# Patient Record
Sex: Male | Born: 2000 | Race: Black or African American | Hispanic: No | Marital: Single | Smoking: Never smoker
Health system: Southern US, Community
[De-identification: ages and names within clinical notes are randomized; demographics above are authoritative.]

## PROBLEM LIST (undated history)

## (undated) DIAGNOSIS — J45909 Unspecified asthma, uncomplicated: Secondary | ICD-10-CM

## (undated) DIAGNOSIS — J302 Other seasonal allergic rhinitis: Secondary | ICD-10-CM

---
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
## 2019-09-10 ENCOUNTER — Other Ambulatory Visit: Payer: Self-pay

## 2019-09-10 DIAGNOSIS — Z20822 Contact with and (suspected) exposure to covid-19: Secondary | ICD-10-CM

## 2019-09-12 LAB — NOVEL CORONAVIRUS, NAA: SARS-CoV-2, NAA: NOT DETECTED

## 2019-11-17 ENCOUNTER — Ambulatory Visit: Payer: Medicaid Other | Attending: Internal Medicine

## 2019-11-17 DIAGNOSIS — Z20822 Contact with and (suspected) exposure to covid-19: Secondary | ICD-10-CM

## 2019-11-18 LAB — NOVEL CORONAVIRUS, NAA: SARS-CoV-2, NAA: NOT DETECTED

## 2020-01-16 ENCOUNTER — Ambulatory Visit: Payer: Medicaid Other | Attending: Internal Medicine

## 2020-07-19 ENCOUNTER — Other Ambulatory Visit: Payer: Medicaid Other
